# Patient Record
Sex: Male | Born: 1990 | Race: Black or African American | Hispanic: No | Marital: Single | Smoking: Current every day smoker
Health system: Southern US, Community
[De-identification: ages and names within clinical notes are randomized; demographics above are authoritative.]

---
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
## 2008-07-07 ENCOUNTER — Emergency Department: Payer: Self-pay | Admitting: Unknown Physician Specialty

## 2008-07-11 ENCOUNTER — Emergency Department: Payer: Self-pay | Admitting: Emergency Medicine

## 2011-06-01 ENCOUNTER — Emergency Department: Payer: Self-pay | Admitting: Emergency Medicine

## 2012-08-11 ENCOUNTER — Emergency Department: Payer: Self-pay | Admitting: Emergency Medicine

## 2013-09-14 ENCOUNTER — Emergency Department: Payer: Self-pay | Admitting: Emergency Medicine

## 2014-12-19 ENCOUNTER — Encounter: Payer: Self-pay | Admitting: Emergency Medicine

## 2014-12-19 ENCOUNTER — Emergency Department
Admission: EM | Admit: 2014-12-19 | Discharge: 2014-12-19 | Disposition: A | Discharge status: Home / Self Care | Payer: Self-pay | Attending: Emergency Medicine | Admitting: Emergency Medicine

## 2014-12-19 ENCOUNTER — Emergency Department: Payer: Self-pay

## 2014-12-19 DIAGNOSIS — W2209XA Striking against other stationary object, initial encounter: Secondary | ICD-10-CM | POA: Insufficient documentation

## 2014-12-19 DIAGNOSIS — Y9389 Activity, other specified: Secondary | ICD-10-CM | POA: Insufficient documentation

## 2014-12-19 DIAGNOSIS — Z720 Tobacco use: Secondary | ICD-10-CM | POA: Insufficient documentation

## 2014-12-19 DIAGNOSIS — S62231B Other displaced fracture of base of first metacarpal bone, right hand, initial encounter for open fracture: Secondary | ICD-10-CM | POA: Insufficient documentation

## 2014-12-19 DIAGNOSIS — Y9289 Other specified places as the place of occurrence of the external cause: Secondary | ICD-10-CM | POA: Insufficient documentation

## 2014-12-19 DIAGNOSIS — Y998 Other external cause status: Secondary | ICD-10-CM | POA: Insufficient documentation

## 2014-12-19 MED ORDER — HYDROCODONE-ACETAMINOPHEN 5-325 MG PO TABS: 1.0000 | ORAL_TABLET | ORAL | Status: DC | PRN

## 2014-12-19 MED ORDER — IBUPROFEN 800 MG PO TABS: 800.0000 mg | ORAL_TABLET | Freq: Three times a day (TID) | ORAL | Status: DC

## 2014-12-19 MED ORDER — IBUPROFEN 800 MG PO TABS
800.0000 mg | ORAL_TABLET | Freq: Once | ORAL | Status: AC
  Administered 2014-12-19: 800 mg via ORAL
  Filled 2014-12-19: qty 1

## 2014-12-19 NOTE — ED Notes (Signed)
Pt reports Tuesday night hitting the medial side of his right hand on the laminate, reports increased pain and swelling since then.
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
## 2015-03-12 ENCOUNTER — Emergency Department
Admission: EM | Admit: 2015-03-12 | Discharge: 2015-03-12 | Disposition: A | Discharge status: Other Acute Inpatient Hospital | Payer: No Typology Code available for payment source | Attending: Emergency Medicine | Admitting: Emergency Medicine

## 2015-03-12 ENCOUNTER — Emergency Department: Payer: No Typology Code available for payment source

## 2015-03-12 DIAGNOSIS — R079 Chest pain, unspecified: Secondary | ICD-10-CM

## 2015-03-12 DIAGNOSIS — R451 Restlessness and agitation: Secondary | ICD-10-CM | POA: Insufficient documentation

## 2015-03-12 DIAGNOSIS — S3991XA Unspecified injury of abdomen, initial encounter: Secondary | ICD-10-CM | POA: Insufficient documentation

## 2015-03-12 DIAGNOSIS — Y92410 Unspecified street and highway as the place of occurrence of the external cause: Secondary | ICD-10-CM | POA: Diagnosis not present

## 2015-03-12 DIAGNOSIS — F1210 Cannabis abuse, uncomplicated: Secondary | ICD-10-CM | POA: Insufficient documentation

## 2015-03-12 DIAGNOSIS — S79911A Unspecified injury of right hip, initial encounter: Secondary | ICD-10-CM | POA: Diagnosis present

## 2015-03-12 DIAGNOSIS — F1310 Sedative, hypnotic or anxiolytic abuse, uncomplicated: Secondary | ICD-10-CM | POA: Insufficient documentation

## 2015-03-12 DIAGNOSIS — F1110 Opioid abuse, uncomplicated: Secondary | ICD-10-CM | POA: Insufficient documentation

## 2015-03-12 DIAGNOSIS — F911 Conduct disorder, childhood-onset type: Secondary | ICD-10-CM | POA: Insufficient documentation

## 2015-03-12 DIAGNOSIS — Z791 Long term (current) use of non-steroidal anti-inflammatories (NSAID): Secondary | ICD-10-CM | POA: Diagnosis not present

## 2015-03-12 DIAGNOSIS — S73014A Posterior dislocation of right hip, initial encounter: Secondary | ICD-10-CM | POA: Insufficient documentation

## 2015-03-12 DIAGNOSIS — Y9389 Activity, other specified: Secondary | ICD-10-CM | POA: Insufficient documentation

## 2015-03-12 DIAGNOSIS — Y998 Other external cause status: Secondary | ICD-10-CM | POA: Insufficient documentation

## 2015-03-12 DIAGNOSIS — F10120 Alcohol abuse with intoxication, uncomplicated: Secondary | ICD-10-CM | POA: Diagnosis not present

## 2015-03-12 DIAGNOSIS — F17210 Nicotine dependence, cigarettes, uncomplicated: Secondary | ICD-10-CM | POA: Diagnosis not present

## 2015-03-12 DIAGNOSIS — S73004A Unspecified dislocation of right hip, initial encounter: Secondary | ICD-10-CM

## 2015-03-12 DIAGNOSIS — F10920 Alcohol use, unspecified with intoxication, uncomplicated: Secondary | ICD-10-CM

## 2015-03-12 LAB — URINALYSIS COMPLETE WITH MICROSCOPIC (ARMC ONLY)
BILIRUBIN URINE: NEGATIVE
GLUCOSE, UA: NEGATIVE mg/dL
LEUKOCYTES UA: NEGATIVE
Nitrite: NEGATIVE
Protein, ur: NEGATIVE mg/dL
SQUAMOUS EPITHELIAL / LPF: NONE SEEN
Specific Gravity, Urine: 1.058 — ABNORMAL HIGH (ref 1.005–1.030)
pH: 5 (ref 5.0–8.0)

## 2015-03-12 LAB — URINE DRUG SCREEN, QUALITATIVE (ARMC ONLY)
AMPHETAMINES, UR SCREEN: NOT DETECTED
BARBITURATES, UR SCREEN: NOT DETECTED
BENZODIAZEPINE, UR SCRN: POSITIVE — AB
Cannabinoid 50 Ng, Ur ~~LOC~~: POSITIVE — AB
Cocaine Metabolite,Ur ~~LOC~~: NOT DETECTED
MDMA (Ecstasy)Ur Screen: NOT DETECTED
METHADONE SCREEN, URINE: NOT DETECTED
Opiate, Ur Screen: POSITIVE — AB
Phencyclidine (PCP) Ur S: NOT DETECTED
TRICYCLIC, UR SCREEN: NOT DETECTED

## 2015-03-12 LAB — BLOOD GAS, ARTERIAL
ALLENS TEST (PASS/FAIL): POSITIVE — AB
Acid-base deficit: 3.4 mmol/L — ABNORMAL HIGH (ref 0.0–2.0)
Bicarbonate: 21.4 mEq/L (ref 21.0–28.0)
FIO2: 0.4
LHR: 18 {breaths}/min
MECHANICAL RATE: 18
MECHVT: 500 mL
O2 Saturation: 99 %
PATIENT TEMPERATURE: 37
PCO2 ART: 37 mmHg (ref 32.0–48.0)
PEEP: 10 cmH2O
PO2 ART: 136 mmHg — AB (ref 83.0–108.0)
pH, Arterial: 7.37 (ref 7.350–7.450)

## 2015-03-12 LAB — COMPREHENSIVE METABOLIC PANEL
ALT: 33 U/L (ref 17–63)
AST: 28 U/L (ref 15–41)
Albumin: 4.2 g/dL (ref 3.5–5.0)
Alkaline Phosphatase: 74 U/L (ref 38–126)
Anion gap: 14 (ref 5–15)
BUN: 10 mg/dL (ref 6–20)
CHLORIDE: 105 mmol/L (ref 101–111)
CO2: 21 mmol/L — AB (ref 22–32)
CREATININE: 0.86 mg/dL (ref 0.61–1.24)
Calcium: 9.1 mg/dL (ref 8.9–10.3)
GFR calc Af Amer: >60 mL/min (ref >60)
Glucose, Bld: 127 mg/dL — ABNORMAL HIGH (ref 65–99)
POTASSIUM: 3 mmol/L — AB (ref 3.5–5.1)
SODIUM: 140 mmol/L (ref 135–145)
Total Bilirubin: 1 mg/dL (ref 0.3–1.2)
Total Protein: 7.5 g/dL (ref 6.5–8.1)

## 2015-03-12 LAB — CBC
HCT: 42.2 % (ref 40.0–52.0)
Hemoglobin: 13.6 g/dL (ref 13.0–18.0)
MCH: 23.9 pg — ABNORMAL LOW (ref 26.0–34.0)
MCHC: 32.3 g/dL (ref 32.0–36.0)
MCV: 74.1 fL — AB (ref 80.0–100.0)
PLATELETS: 225 10*3/uL (ref 150–440)
RBC: 5.7 MIL/uL (ref 4.40–5.90)
RDW: 15 % — AB (ref 11.5–14.5)
WBC: 13 10*3/uL — ABNORMAL HIGH (ref 3.8–10.6)

## 2015-03-12 LAB — ETHANOL: Alcohol, Ethyl (B): 247 mg/dL — ABNORMAL HIGH (ref <5)

## 2015-03-12 LAB — PROTIME-INR
INR: 1.07
PROTHROMBIN TIME: 14.1 s (ref 11.4–15.0)

## 2015-03-12 MED ORDER — LORAZEPAM 2 MG/ML IJ SOLN
1.0000 mg | Freq: Once | INTRAMUSCULAR | Status: AC
  Administered 2015-03-12: 1 mg via INTRAVENOUS

## 2015-03-12 MED ORDER — ROCURONIUM BROMIDE 50 MG/5ML IV SOLN
10.0000 mg | Freq: Once | INTRAVENOUS | Status: AC
  Administered 2015-03-12: 10 mg via INTRAVENOUS

## 2015-03-12 MED ORDER — LORAZEPAM 2 MG/ML IJ SOLN
INTRAMUSCULAR | Status: AC
  Filled 2015-03-12: qty 1

## 2015-03-12 MED ORDER — PROPOFOL 1000 MG/100ML IV EMUL
5.0000 ug/kg/min | INTRAVENOUS | Status: DC
  Administered 2015-03-12: 25 ug/kg/min via INTRAVENOUS
  Administered 2015-03-12: 15 ug/kg/min via INTRAVENOUS
  Administered 2015-03-12: 40 ug/kg/min via INTRAVENOUS

## 2015-03-12 MED ORDER — DIPHENHYDRAMINE HCL 50 MG/ML IJ SOLN
50.0000 mg | Freq: Once | INTRAMUSCULAR | Status: AC
  Administered 2015-03-12: 50 mg via INTRAVENOUS

## 2015-03-12 MED ORDER — HALOPERIDOL LACTATE 5 MG/ML IJ SOLN
INTRAMUSCULAR | Status: AC
  Administered 2015-03-12: 5 mg via INTRAVENOUS
  Filled 2015-03-12: qty 1

## 2015-03-12 MED ORDER — DIPHENHYDRAMINE HCL 50 MG/ML IJ SOLN
INTRAMUSCULAR | Status: AC
  Administered 2015-03-12: 50 mg via INTRAVENOUS
  Filled 2015-03-12: qty 1

## 2015-03-12 MED ORDER — LORAZEPAM 2 MG/ML IJ SOLN
2.0000 mg | Freq: Once | INTRAMUSCULAR | Status: AC
  Administered 2015-03-12: 2 mg via INTRAVENOUS

## 2015-03-12 MED ORDER — ETOMIDATE 2 MG/ML IV SOLN
20.0000 mg | Freq: Once | INTRAVENOUS | Status: AC
  Administered 2015-03-12: 20 mg via INTRAVENOUS

## 2015-03-12 MED ORDER — MORPHINE SULFATE (PF) 4 MG/ML IV SOLN
INTRAVENOUS | Status: AC
  Administered 2015-03-12: 4 mg via INTRAVENOUS
  Filled 2015-03-12: qty 1

## 2015-03-12 MED ORDER — MORPHINE SULFATE (PF) 4 MG/ML IV SOLN
4.0000 mg | Freq: Once | INTRAVENOUS | Status: AC
  Administered 2015-03-12: 4 mg via INTRAVENOUS

## 2015-03-12 MED ORDER — ONDANSETRON HCL 4 MG/2ML IJ SOLN
INTRAMUSCULAR | Status: AC
  Administered 2015-03-12: 4 mg via INTRAVENOUS
  Filled 2015-03-12: qty 2

## 2015-03-12 MED ORDER — SUCCINYLCHOLINE CHLORIDE 20 MG/ML IJ SOLN
100.0000 mg | Freq: Once | INTRAMUSCULAR | Status: AC
  Administered 2015-03-12: 100 mg via INTRAVENOUS

## 2015-03-12 MED ORDER — ONDANSETRON HCL 4 MG/2ML IJ SOLN
4.0000 mg | Freq: Once | INTRAMUSCULAR | Status: AC
  Administered 2015-03-12: 4 mg via INTRAVENOUS

## 2015-03-12 MED ORDER — MIDAZOLAM HCL 5 MG/5ML IJ SOLN
5.0000 mg | Freq: Once | INTRAMUSCULAR | Status: AC
  Administered 2015-03-12: 5 mg via INTRAVENOUS

## 2015-03-12 MED ORDER — IOHEXOL 350 MG/ML SOLN
125.0000 mL | Freq: Once | INTRAVENOUS | Status: AC | PRN
  Administered 2015-03-12: 125 mL via INTRAVENOUS

## 2015-03-12 MED ORDER — SODIUM CHLORIDE 0.9 % IV BOLUS (SEPSIS)
1000.0000 mL | Freq: Once | INTRAVENOUS | Status: AC
  Administered 2015-03-12: 1000 mL via INTRAVENOUS

## 2015-03-12 MED ORDER — HALOPERIDOL LACTATE 5 MG/ML IJ SOLN
INTRAMUSCULAR | Status: AC
  Filled 2015-03-12: qty 1

## 2015-03-12 MED ORDER — HALOPERIDOL LACTATE 5 MG/ML IJ SOLN
5.0000 mg | Freq: Once | INTRAMUSCULAR | Status: AC
  Administered 2015-03-12: 5 mg via INTRAVENOUS

## 2015-03-12 MED ORDER — LORAZEPAM 2 MG/ML IJ SOLN
INTRAMUSCULAR | Status: AC
  Administered 2015-03-12: 1 mg via INTRAVENOUS
  Filled 2015-03-12: qty 1

## 2015-03-12 NOTE — ED Notes (Signed)
Necklace with cross and two earrings given to pt's mother, Riley Morgan

## 2015-03-12 NOTE — ED Notes (Signed)
Report called to Doree FudgeLuz, [phone] Care Coordinator, and ED charge Elpidio GaleaSusan, RN [phone] at [phone],  [facility], signature pad inop in pt room, Father and mother Briscoe Burnsrchie and Dot Lanesmy Fuller, both signed consent for transfer.
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
## 2015-03-12 NOTE — ED Notes (Signed)
[name] to inform of pt's en route

## 2015-03-12 NOTE — ED Notes (Signed)
Pt repeatedly states: "help me man. Don't do me like this man. Man it hurt." Pt is combative with nursing/medical staff.  Pt does not follow instructions.
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
## 2015-03-24 ENCOUNTER — Emergency Department
Admission: EM | Admit: 2015-03-24 | Discharge: 2015-03-24 | Disposition: A | Discharge status: Home / Self Care | Payer: Self-pay | Attending: Emergency Medicine | Admitting: Emergency Medicine

## 2015-03-24 ENCOUNTER — Emergency Department: Payer: Self-pay

## 2015-03-24 ENCOUNTER — Encounter: Payer: Self-pay | Admitting: Emergency Medicine

## 2015-03-24 DIAGNOSIS — M25551 Pain in right hip: Secondary | ICD-10-CM | POA: Insufficient documentation

## 2015-03-24 DIAGNOSIS — Z87891 Personal history of nicotine dependence: Secondary | ICD-10-CM | POA: Insufficient documentation

## 2015-03-24 MED ORDER — HYDROCODONE-ACETAMINOPHEN 5-325 MG PO TABS: 1.0000 | ORAL_TABLET | ORAL | Status: DC | PRN

## 2015-03-24 MED ORDER — KETOROLAC TROMETHAMINE 30 MG/ML IJ SOLN
30.0000 mg | Freq: Once | INTRAMUSCULAR | Status: AC
  Administered 2015-03-24: 30 mg via INTRAMUSCULAR
  Filled 2015-03-24: qty 1

## 2015-03-24 MED ORDER — NAPROXEN 500 MG PO TABS: 500.0000 mg | ORAL_TABLET | Freq: Two times a day (BID) | ORAL | Status: DC

## 2015-03-24 NOTE — Discharge Instructions (Signed)
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
## 2017-12-06 ENCOUNTER — Emergency Department: Payer: No Typology Code available for payment source

## 2017-12-06 ENCOUNTER — Encounter: Payer: Self-pay | Admitting: Emergency Medicine

## 2017-12-06 ENCOUNTER — Other Ambulatory Visit: Payer: Self-pay

## 2017-12-06 ENCOUNTER — Emergency Department
Admission: EM | Admit: 2017-12-06 | Discharge: 2017-12-06 | Disposition: A | Discharge status: Home / Self Care | Payer: No Typology Code available for payment source | Attending: Emergency Medicine | Admitting: Emergency Medicine

## 2017-12-06 DIAGNOSIS — M7918 Myalgia, other site: Secondary | ICD-10-CM | POA: Diagnosis not present

## 2017-12-06 DIAGNOSIS — Z87891 Personal history of nicotine dependence: Secondary | ICD-10-CM | POA: Insufficient documentation

## 2017-12-06 DIAGNOSIS — M542 Cervicalgia: Secondary | ICD-10-CM | POA: Diagnosis present

## 2017-12-06 DIAGNOSIS — Z79899 Other long term (current) drug therapy: Secondary | ICD-10-CM | POA: Insufficient documentation

## 2017-12-06 MED ORDER — CYCLOBENZAPRINE HCL 5 MG PO TABS: 5.0000 mg | ORAL_TABLET | Freq: Three times a day (TID) | ORAL | 0 refills | Status: AC | PRN

## 2017-12-06 MED ORDER — IBUPROFEN 600 MG PO TABS: 600.0000 mg | ORAL_TABLET | Freq: Four times a day (QID) | ORAL | 0 refills | Status: DC | PRN
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
## 2018-11-06 ENCOUNTER — Encounter: Payer: Self-pay | Admitting: Emergency Medicine

## 2018-11-06 ENCOUNTER — Emergency Department
Admission: EM | Admit: 2018-11-06 | Discharge: 2018-11-07 | Disposition: A | Discharge status: Home / Self Care | Payer: Self-pay | Attending: Emergency Medicine | Admitting: Emergency Medicine

## 2018-11-06 ENCOUNTER — Other Ambulatory Visit: Payer: Self-pay

## 2018-11-06 ENCOUNTER — Emergency Department: Payer: Self-pay

## 2018-11-06 DIAGNOSIS — W010XXA Fall on same level from slipping, tripping and stumbling without subsequent striking against object, initial encounter: Secondary | ICD-10-CM | POA: Insufficient documentation

## 2018-11-06 DIAGNOSIS — Y999 Unspecified external cause status: Secondary | ICD-10-CM | POA: Insufficient documentation

## 2018-11-06 DIAGNOSIS — Y9389 Activity, other specified: Secondary | ICD-10-CM | POA: Insufficient documentation

## 2018-11-06 DIAGNOSIS — Y929 Unspecified place or not applicable: Secondary | ICD-10-CM | POA: Insufficient documentation

## 2018-11-06 DIAGNOSIS — Z87891 Personal history of nicotine dependence: Secondary | ICD-10-CM | POA: Insufficient documentation

## 2018-11-06 DIAGNOSIS — Z79899 Other long term (current) drug therapy: Secondary | ICD-10-CM | POA: Insufficient documentation

## 2018-11-06 DIAGNOSIS — S63259A Unspecified dislocation of unspecified finger, initial encounter: Secondary | ICD-10-CM

## 2018-11-06 DIAGNOSIS — S62653A Nondisplaced fracture of medial phalanx of left middle finger, initial encounter for closed fracture: Secondary | ICD-10-CM | POA: Insufficient documentation

## 2018-11-06 MED ORDER — HYDROMORPHONE HCL 1 MG/ML IJ SOLN
1.0000 mg | INTRAMUSCULAR | Status: AC
  Administered 2018-11-06: 1 mg via INTRAMUSCULAR
  Filled 2018-11-06: qty 1

## 2018-11-06 NOTE — ED Triage Notes (Signed)
Patient ambulatory to triage with steady gait, without difficulty, appears uncomfortable--diaphoretic, restless; pt reports slipped and fell attempting to catch self with hand; deformity noted to left 3rd finger

## 2018-11-07 ENCOUNTER — Emergency Department: Payer: Self-pay

## 2018-11-07 MED ORDER — LIDOCAINE HCL (PF) 1 % IJ SOLN
5.0000 mL | Freq: Once | INTRAMUSCULAR | Status: AC
  Administered 2018-11-07: 5 mL via INTRADERMAL
  Filled 2018-11-07: qty 5

## 2018-11-07 NOTE — Discharge Instructions (Signed)
Please keep your fingers taped for 1-2 weeks to support your dislocated joint in the middle finger.  The bone has a small crack as well, but this likely will not need any additional treatment.  I strongly recommend that you follow-up with a hand specialist to make sure that you get back to full usage of your hand.  Please call the phone number listed above.  Use over-the-counter ibuprofen and Tylenol as needed.  Keep the wound between the 2 fingers clean and dry and return to the ED if you develop any signs or symptoms of infection.

## 2018-11-07 NOTE — ED Notes (Signed)
Finger splint in place with wrap.

## 2018-11-07 NOTE — ED Provider Notes (Signed)
[facility] Emergency Department Provider Note  ____________________________________________   First MD Initiated Contact with Patient 11/06/18 2357     (approximate)  I have reviewed the triage vital signs and the nursing notes.   HISTORY  Chief Complaint Finger Injury    HPI Riley Morgan is a 28 y.o. male with no contributory past medical history who presents for evaluation of deformity and severe pain in his left middle finger.  It was acute in onset and the pain is severe.  He reports that he had a few beers tonight and then he stumbled and fell and try to catch himself but caught his left middle finger on something and pulled it out of place.  He has little bit of bleeding and is not sure where it was coming from.  Bleeding is currently well controlled.  He has no other pain.  He did not lose consciousness nor strike his head and he denies headache and neck pain.  He has no pain anywhere else.  Try to move the finger makes it worse and nothing particular makes it better.         History reviewed. No pertinent past medical history.  There are no active problems to display for this patient.   History reviewed. No pertinent surgical history.  Prior to Admission medications   Medication Sig Start Date End Date Taking? Authorizing Provider  HYDROcodone-acetaminophen (NORCO/VICODIN) 5-325 MG tablet Take 1 tablet by mouth every 4 (four) hours as needed for moderate pain. 03/24/15   Jene EveryKinner, Robert, MD  ibuprofen (ADVIL,MOTRIN) 600 MG tablet Take 1 tablet (600 mg total) by mouth every 6 (six) hours as needed. 12/06/17   Enid DerryWagner, Ashley, PA-C  naproxen (NAPROSYN) 500 MG tablet Take 1 tablet (500 mg total) by mouth 2 (two) times daily with a meal. 03/24/15   Jene EveryKinner, Robert, MD  oxyCODONE (OXY IR/ROXICODONE) 5 MG immediate release tablet Take 5-10 mg by mouth every 4 (four) hours as needed for severe pain.  03/14/15   [provider]     Allergies Patient has no known allergies.  No family history on file.  Social History Social History   Tobacco Use   Smoking status: Former Smoker    Types: Cigarettes   Smokeless tobacco: Never Used  Substance Use Topics   Alcohol use: No    Comment: 5th a week   Drug use: Yes    Types: Marijuana    Review of Systems Constitutional: No fever/chills Eyes: No visual changes. ENT: No sore throat. Cardiovascular: Denies chest pain. Respiratory: Denies shortness of breath. Gastrointestinal: No abdominal pain.  No nausea, no vomiting.  No diarrhea.  No constipation. Genitourinary: Negative for dysuria. Musculoskeletal: Severe pain in left middle finger.  Negative for neck pain.  Negative for back pain. Integumentary: Negative for rash. Neurological: Negative for headaches, focal weakness or numbness.   ____________________________________________   PHYSICAL EXAM:  VITAL SIGNS: ED Triage Vitals  Enc Vitals Group     BP 11/07/18 0005 (!) 139/98     Pulse Rate 11/06/18 2335 93     Resp 11/06/18 2335 20     Temp 11/06/18 2335 98 F (36.7 C)     Temp Source 11/06/18 2335 Oral     SpO2 11/06/18 2335 98 %     Weight 11/06/18 2335 124.7 kg (275 lb)     Height 11/06/18 2335 1.727 m (5\' 8" )     Head Circumference --      Peak Flow --  Pain Score 11/06/18 2341 10     Pain Loc --      Pain Edu? --      Excl. in [place]? --     Constitutional: Alert and oriented.  Appears to be in pain but is not in any respiratory distress. Eyes: Conjunctivae are normal.  Head: Atraumatic. Cardiovascular: Normal rate, regular rhythm. Good peripheral circulation. Respiratory: Normal respiratory effort.  No retractions.  Musculoskeletal: Gross deformity of the left middle finger at the PIP with apparent dorsal dislocation of the middle phalanx over the top of the proximal phalanx.  The skin is tenting on the palmar side at the PIP but the skin is not disrupted.  There is a small  laceration in between the middle and ring fingers of the left hand that likely is due to the force with which the fingers were ripped apart, but the bleeding is well controlled and the wound is small and does not require closure. Neurologic:  Normal speech and language. No gross focal neurologic deficits are appreciated.  Skin:  Skin is warm, dry and intact. No rash noted. Psychiatric: Mood and affect are normal. Speech and behavior are normal.  ____________________________________________   LABS (all labs ordered are listed, but only abnormal results are displayed)  Labs Reviewed - No data to display ____________________________________________  EKG  None - EKG not ordered by ED physician ____________________________________________  RADIOLOGY I, Hinda Kehr, personally viewed and evaluated these images (plain radiographs) as part of my medical decision making, as well as reviewing the written report by the radiologist.  ED MD interpretation: Dorsal dislocation at the PIP.  Minimally displaced fracture about the dorsal cortex of the middle phalanx. Post-reduction films re-demonstrate the small fracture but the dislocation was adequately reduced.  Official radiology report(s): Dg Finger Middle Left  Result Date: 11/07/2018 CLINICAL DATA:  Postreduction. EXAM: LEFT MIDDLE FINGER 2+V COMPARISON:  Pre reduction radiograph yesterday. FINDINGS: Interval reduction of the middle phalanx dislocation at the proximal interphalangeal joint. Alignment is anatomic. Nondisplaced fracture involving the dorsal cortex of the middle phalanx at the articular surface. Generalized soft tissue edema. IMPRESSION: Reduction of prior middle phalanx dislocation at the proximal interphalangeal joint. Nondisplaced fracture involving the dorsal cortex of the middle phalanx. Electronically Signed   By: Keith Rake M.D.   On: 11/07/2018 02:05   Dg Finger Middle Left  Result Date: 11/07/2018 CLINICAL DATA:  Slip  and fall with deformity to left third finger. EXAM: LEFT MIDDLE FINGER 2+V COMPARISON:  None. FINDINGS: Dorsal dislocation of the digit at the proximal interphalangeal joint. Minimally displaced fracture about the dorsal cortex of the middle phalanx at the articular surface. Distal phalanx remains normally aligned with the proximal phalanx. IMPRESSION: Dorsal dislocation of the digit at the proximal interphalangeal joint. Minimally displaced fracture about the dorsal cortex of the middle phalanx at the articular surface. Electronically Signed   By: Keith Rake M.D.   On: 11/07/2018 00:01   Dg Finger Ring Left  Result Date: 11/07/2018 CLINICAL DATA:  Left ring finger pain and swelling. EXAM: LEFT RING FINGER 2+V COMPARISON:  None. FINDINGS: There is no evidence of fracture or dislocation. There is no evidence of arthropathy or other focal bone abnormality. Mild soft tissue edema. IMPRESSION: Soft tissue edema without fracture or dislocation. Electronically Signed   By: Keith Rake M.D.   On: 11/07/2018 02:06    ____________________________________________   PROCEDURES   Procedure(s) performed (including Critical Care):  .Ortho Injury Treatment  Date/Time: 11/07/2018 1:03 AM  Performed by: Loleta RoseForbach, Jace Dowe, MD Authorized by: Loleta RoseForbach, Amalio Loe, MD   Consent:    Consent obtained:  Verbal   Consent given by:  Patient   Risks discussed:  Fracture, nerve damage, restricted joint movement and stiffnessInjury location: finger Location details: left long finger Injury type: dislocation Dislocation type: PIP Pre-procedure neurovascular assessment: neurovascularly intact Pre-procedure distal perfusion: normal Pre-procedure neurological function: normal Pre-procedure range of motion: reduced Anesthesia: digital block  Anesthesia: Local anesthesia used: yes Local Anesthetic: lidocaine 1% without epinephrine Anesthetic total: 5 mL  Patient sedated: NoManipulation performed: yes Reduction  successful: yes X-ray confirmed reduction: yes Immobilization: tape Splint type: buddy tape. Post-procedure neurovascular assessment: post-procedure neurovascularly intact Post-procedure distal perfusion: normal Post-procedure neurological function: normal Post-procedure range of motion: normal Patient tolerance: patient tolerated the procedure well with no immediate complications      ____________________________________________   INITIAL IMPRESSION / MDM / ASSESSMENT AND PLAN / ED COURSE  As part of my medical decision making, I reviewed the following data within the electronic MEDICAL RECORD NUMBER Nursing notes reviewed and incorporated, Old chart reviewed, Radiograph reviewed , Notes from prior ED visits and [place] Controlled Substance Database   Patient has a dislocated PIP on the left middle finger with a very small nondisplaced cortical fracture on the dorsal side.  I performed a digital block after he received Dilaudid 1 mg IV and achieved adequate analgesia.  I then manipulated the finger and reduced the dislocation, verified by x-ray.  The patient was eager to leave after the images and I was not able to get back into see the room due to other critical patients, but the nurse was able to buddy tape his middle finger to the ring finger and I had already given him my usual and customary management recommendations and return precautions.  No indication for antibiotics.  This was not a closed fracture even though he does have a skin tear between the middle and ring fingers and I encouraged him to keep it clean and dry as much as possible.          ____________________________________________  FINAL CLINICAL IMPRESSION(S) / ED DIAGNOSES  Final diagnoses:  Dislocation of finger, initial encounter  Closed nondisplaced fracture of middle phalanx of left middle finger, initial encounter     MEDICATIONS GIVEN DURING THIS VISIT:  Medications  HYDROmorphone (DILAUDID) injection 1 mg  (1 mg Intramuscular Given 11/06/18 2357)  lidocaine (PF) (XYLOCAINE) 1 % injection 5 mL (5 mLs Intradermal Given 11/07/18 0053)     ED Discharge Orders    None      *Please note:  Riley Morgan was evaluated in Emergency Department on 11/07/2018 for the symptoms described in the history of present illness. He was evaluated in the context of the global COVID-19 pandemic, which necessitated consideration that the patient might be at risk for infection with the SARS-CoV-2 virus that causes COVID-19. Institutional protocols and algorithms that pertain to the evaluation of patients at risk for COVID-19 are in a state of rapid change based on information released by regulatory bodies including the CDC and federal and state organizations. These policies and algorithms were followed during the patient's care in the ED.  Some ED evaluations and interventions may be delayed as a result of limited staffing during the pandemic.*  Note:  This document was prepared using Dragon voice recognition software and may include unintentional dictation errors.   Loleta RoseForbach, Tarris Delbene, MD 11/07/18 [phone]

## 2019-04-06 ENCOUNTER — Encounter: Payer: Self-pay | Admitting: Emergency Medicine

## 2019-04-06 ENCOUNTER — Emergency Department: Payer: Self-pay

## 2019-04-06 ENCOUNTER — Other Ambulatory Visit: Payer: Self-pay

## 2019-04-06 ENCOUNTER — Emergency Department
Admission: EM | Admit: 2019-04-06 | Discharge: 2019-04-06 | Disposition: A | Discharge status: Home / Self Care | Payer: Self-pay | Attending: Emergency Medicine | Admitting: Emergency Medicine

## 2019-04-06 DIAGNOSIS — K30 Functional dyspepsia: Secondary | ICD-10-CM | POA: Insufficient documentation

## 2019-04-06 DIAGNOSIS — F17210 Nicotine dependence, cigarettes, uncomplicated: Secondary | ICD-10-CM | POA: Insufficient documentation

## 2019-04-06 DIAGNOSIS — R0789 Other chest pain: Secondary | ICD-10-CM | POA: Insufficient documentation

## 2019-04-06 LAB — CBC WITH DIFFERENTIAL/PLATELET
Abs Immature Granulocytes: 0.04 10*3/uL (ref 0.00–0.07)
Basophils Absolute: 0 10*3/uL (ref 0.0–0.1)
Basophils Relative: 1 %
Eosinophils Absolute: 0.2 10*3/uL (ref 0.0–0.5)
Eosinophils Relative: 3 %
HCT: 43.9 % (ref 39.0–52.0)
Hemoglobin: 14 g/dL (ref 13.0–17.0)
Immature Granulocytes: 1 %
Lymphocytes Relative: 26 %
Lymphs Abs: 2 10*3/uL (ref 0.7–4.0)
MCH: 24 pg — ABNORMAL LOW (ref 26.0–34.0)
MCHC: 31.9 g/dL (ref 30.0–36.0)
MCV: 75.2 fL — ABNORMAL LOW (ref 80.0–100.0)
Monocytes Absolute: 0.3 10*3/uL (ref 0.1–1.0)
Monocytes Relative: 5 %
Neutro Abs: 4.9 10*3/uL (ref 1.7–7.7)
Neutrophils Relative %: 64 %
Platelets: 233 10*3/uL (ref 150–400)
RBC: 5.84 MIL/uL — ABNORMAL HIGH (ref 4.22–5.81)
RDW: 14.6 % (ref 11.5–15.5)
WBC: 7.5 10*3/uL (ref 4.0–10.5)
nRBC: 0 % (ref 0.0–0.2)

## 2019-04-06 LAB — URINALYSIS, COMPLETE (UACMP) WITH MICROSCOPIC
Bacteria, UA: NONE SEEN
Bilirubin Urine: NEGATIVE
Glucose, UA: NEGATIVE mg/dL
Hgb urine dipstick: NEGATIVE
Ketones, ur: NEGATIVE mg/dL
Leukocytes,Ua: NEGATIVE
Nitrite: NEGATIVE
Protein, ur: NEGATIVE mg/dL
Specific Gravity, Urine: 1.019 (ref 1.005–1.030)
pH: 7 (ref 5.0–8.0)

## 2019-04-06 LAB — COMPREHENSIVE METABOLIC PANEL
ALT: 29 U/L (ref 0–44)
AST: 24 U/L (ref 15–41)
Albumin: 4.1 g/dL (ref 3.5–5.0)
Alkaline Phosphatase: 73 U/L (ref 38–126)
Anion gap: 11 (ref 5–15)
BUN: 10 mg/dL (ref 6–20)
CO2: 20 mmol/L — ABNORMAL LOW (ref 22–32)
Calcium: 9.1 mg/dL (ref 8.9–10.3)
Chloride: 107 mmol/L (ref 98–111)
Creatinine, Ser: 0.89 mg/dL (ref 0.61–1.24)
GFR calc Af Amer: >60 mL/min (ref >60)
GFR calc non Af Amer: >60 mL/min (ref >60)
Glucose, Bld: 123 mg/dL — ABNORMAL HIGH (ref 70–99)
Potassium: 3.4 mmol/L — ABNORMAL LOW (ref 3.5–5.1)
Sodium: 138 mmol/L (ref 135–145)
Total Bilirubin: 0.9 mg/dL (ref 0.3–1.2)
Total Protein: 7.3 g/dL (ref 6.5–8.1)

## 2019-04-06 LAB — TROPONIN I (HIGH SENSITIVITY): Troponin I (High Sensitivity): 3 ng/L (ref <18)

## 2019-04-06 NOTE — ED Triage Notes (Addendum)
Pt reports has had nausea in morning that will last about 5-10 minutes then go away.  denies any vomiting or diarrhea. No fevers. No abdominal pain.  Today nausea has been on and off all day so decided to come get checked.  Ambulatory without difficulty.  Reports sometimes has felt fine today and then sometimes gets nauseated.  No pain at all.  Unlabored.  C/o SHOB and chest feeling heavy today.  Pt is overweight and smoker but also states "I think I need a check up".  Now states "I feel like I am going to pass out today, just don't feel myself." has been drinking daily for past couple weeks. Drank half gallon the other day.  Last drink yesterday evening.

## 2019-04-06 NOTE — ED Notes (Signed)
Pt standing at door of room asking where the exit is, I asked pt if he was being discharged and he states "there isnt anything wrong with me and that will take 3 hours". Advised patient he is up for discharge and I would get his paperwork to him as soon as possible but patient did not want to wait and walked out to the waiting room

## 2019-04-06 NOTE — Discharge Instructions (Addendum)
For your symptoms:  - STOP eating after 8 PM, and avoid foods high in spice or acid - Try to cut back on smoking  - START taking an over-the-counter antacid.  I would recommend starting with over-the-counter famotidine or Pepcid, 1 tablet daily.  This can be increased to twice a day.  I would give this up to 1 week to see if it helps your symptoms.  -If the Pepcid does not completely resolve your symptoms, consider starting omeprazole or other over-the-counter proton pump inhibitor.  You can discuss with the pharmacist.  - You could also try sitting more upright when sleeping, for example using 2 pillows.

## 2019-04-06 NOTE — ED Provider Notes (Signed)
[facility] Emergency Department Provider Note  ____________________________________________   First MD Initiated Contact with Patient 04/06/19 1531     (approximate)  I have reviewed the triage vital signs and the nursing notes.   HISTORY  Chief Complaint Nausea    HPI Riley Morgan is a 28 y.o. male here with intermittent atypical chest pain indigestion.  The patient states that for the last 6 to 8 months, he has had episodes in which everyone, he wakes up, he has a mild sore throat and hoarseness.  He then experiences mild burning chest pain with associated nausea and sensation that he needs to vomit or spit up.  This usually only occurs after he has been sleeping or lying down.  Denies any leaving factors.  He does not take any regular antacids, but does admit to a history of acid reflux.  No fevers or chills.  No shortness of breath.  No known history of coronary disease.  Symptoms are usually related to waking up as mentioned, or when sitting up after lying down.  No specific alleviating or aggravating factors.  No other complaints.        History reviewed. No pertinent past medical history.  There are no problems to display for this patient.   History reviewed. No pertinent surgical history.  Prior to Admission medications   Not on File    Allergies Patient has no known allergies.  History reviewed. No pertinent family history.  Social History Social History   Tobacco Use  . Smoking status: Current Every Day Smoker    Types: Cigarettes  . Smokeless tobacco: Never Used  Substance Use Topics  . Alcohol use: No    Comment: 5th a week  . Drug use: Yes    Types: Marijuana    Review of Systems  Review of Systems  Constitutional: Negative for chills, fatigue and fever.  HENT: Negative for sore throat.   Respiratory: Negative for shortness of breath.   Cardiovascular: Positive for chest pain.  Gastrointestinal: Positive for  nausea. Negative for abdominal pain.  Genitourinary: Negative for flank pain.  Musculoskeletal: Negative for neck pain.  Skin: Negative for rash and wound.  Allergic/Immunologic: Negative for immunocompromised state.  Neurological: Negative for weakness and numbness.  Hematological: Does not bruise/bleed easily.  All other systems reviewed and are negative.    ____________________________________________  PHYSICAL EXAM:      VITAL SIGNS: ED Triage Vitals  Enc Vitals Group     BP 04/06/19 1403 (!) 128/100     Pulse Rate 04/06/19 1403 73     Resp 04/06/19 1403 18     Temp 04/06/19 1403 98.9 F (37.2 C)     Temp Source 04/06/19 1403 Oral     SpO2 04/06/19 1403 98 %     Weight 04/06/19 1405 280 lb (127 kg)     Height 04/06/19 1405 5\' 8"  (1.727 m)     Head Circumference --      Peak Flow --      Pain Score 04/06/19 1405 0     Pain Loc --      Pain Edu? --      Excl. in [place]? --      Physical Exam Vitals and nursing note reviewed.  Constitutional:      General: He is not in acute distress.    Appearance: He is well-developed.  HENT:     Head: Normocephalic and atraumatic.  Eyes:     Conjunctiva/sclera: Conjunctivae  normal.  Cardiovascular:     Rate and Rhythm: Normal rate and regular rhythm.     Heart sounds: Normal heart sounds.  Pulmonary:     Effort: Pulmonary effort is normal. No respiratory distress.     Breath sounds: No wheezing.  Abdominal:     General: Abdomen is flat. There is no distension.  Musculoskeletal:     Cervical back: Neck supple.  Skin:    General: Skin is warm.     Capillary Refill: Capillary refill takes less than 2 seconds.     Findings: No rash.  Neurological:     Mental Status: He is alert and oriented to person, place, and time.     Motor: No abnormal muscle tone.       ____________________________________________   LABS (all labs ordered are listed, but only abnormal results are displayed)  Labs Reviewed  CBC WITH  DIFFERENTIAL/PLATELET - Abnormal; Notable for the following components:      Result Value   RBC 5.84 (*)    MCV 75.2 (*)    MCH 24.0 (*)    All other components within normal limits  COMPREHENSIVE METABOLIC PANEL - Abnormal; Notable for the following components:   Potassium 3.4 (*)    CO2 20 (*)    Glucose, Bld 123 (*)    All other components within normal limits  URINALYSIS, COMPLETE (UACMP) WITH MICROSCOPIC - Abnormal; Notable for the following components:   Color, Urine YELLOW (*)    APPearance CLEAR (*)    All other components within normal limits  TROPONIN I (HIGH SENSITIVITY)  TROPONIN I (HIGH SENSITIVITY)    ____________________________________________  EKG: Normal sinus rhythm, ventricular rate 82.PR 176, QRS 80, QTc 429.  No acute ST elevations or depressions. ________________________________________  RADIOLOGY All imaging, including plain films, CT scans, and ultrasounds, independently reviewed by me, and interpretations confirmed via formal radiology reads.  ED MD interpretation:     Official radiology report(s): DG Chest 2 View  Result Date: 04/06/2019 CLINICAL DATA:  Chest pain, shortness of breath EXAM: CHEST - 2 VIEW COMPARISON:  03/12/2015 FINDINGS: Normal heart size, mediastinal contours, and pulmonary vascularity. Lungs clear. No pleural effusion or pneumothorax. Bones unremarkable. IMPRESSION: Normal exam. Electronically Signed   By: Ulyses SouthwardMark  Boles M.D.   On: 04/06/2019 14:34    ____________________________________________  PROCEDURES   Procedure(s) performed (including Critical Care):  Procedures  ____________________________________________  INITIAL IMPRESSION / MDM / ASSESSMENT AND PLAN / ED COURSE  As part of my medical decision making, I reviewed the following data within the electronic MEDICAL RECORD NUMBER Nursing notes reviewed and incorporated, Old chart reviewed, Notes from prior ED visits, and [place] Controlled Substance Database        *Riley Morgan was evaluated in Emergency Department on 04/06/2019 for the symptoms described in the history of present illness. He was evaluated in the context of the global COVID-19 pandemic, which necessitated consideration that the patient might be at risk for infection with the SARS-CoV-2 virus that causes COVID-19. Institutional protocols and algorithms that pertain to the evaluation of patients at risk for COVID-19 are in a state of rapid change based on information released by regulatory bodies including the CDC and federal and state organizations. These policies and algorithms were followed during the patient's care in the ED.  Some ED evaluations and interventions may be delayed as a result of limited staffing during the pandemic.*     Medical Decision Making: Very pleasant 28 year old male here with atypical chest  pain after waking up with mild chest burning sensation. On arrival, VSS and WNL. This has been ongoing for months. The association with sitting upright after lying down w/ chest burning and watering in mouth, with association w/ eating late, is most c/w likely acid reflux w/ possible component of PND. No signs of ACS and EKG is nonischemic with neg trop despite constant sx x days to weeks - doubt ACS. Pain is not consistent with PE, dissection. Will have him trial antacids, stop eating late, trial 2 pillows at night, and f/u with a PCP.  ____________________________________________  FINAL CLINICAL IMPRESSION(S) / ED DIAGNOSES  Final diagnoses:  Indigestion  Atypical chest pain     MEDICATIONS GIVEN DURING THIS VISIT:  Medications - No data to display   ED Discharge Orders    None       Note:  This document was prepared using Dragon voice recognition software and may include unintentional dictation errors.   Shaune Pollack, MD 04/06/19 [phone]

## 2019-04-06 NOTE — ED Notes (Signed)
See triage note  States he developed some nausea this am  States the nausea has been on and off all days  Denies any fever or n/v/d   Afebrile on arrival

## 2019-07-14 ENCOUNTER — Other Ambulatory Visit
Admission: AD | Admit: 2019-07-14 | Discharge: 2019-07-14 | Disposition: A | Discharge status: Home / Self Care | Attending: Family Medicine | Admitting: Family Medicine

## 2019-07-14 NOTE — ED Notes (Signed)
Pt arrived to ED for forensic blood draw with [employer] Officer Ayer. Draw was attempted x 3 on pt. First attempt was on left AC and unsuccessful. Draw attempted x 2 with [place] on left AC and without sufficient result. Finally blood draw was attempted x 3 with [place] on right AC with success. All three attempts used Betadine as the cleaning agent and site was left clean, dry, and intact after stick. Pt was escorted by officer from facility.

## 2019-08-26 ENCOUNTER — Emergency Department
Admission: EM | Admit: 2019-08-26 | Discharge: 2019-08-26 | Disposition: A | Discharge status: Home / Self Care | Attending: Student | Admitting: Student

## 2019-08-26 ENCOUNTER — Other Ambulatory Visit: Payer: Self-pay

## 2019-08-26 DIAGNOSIS — F10920 Alcohol use, unspecified with intoxication, uncomplicated: Secondary | ICD-10-CM | POA: Insufficient documentation

## 2019-08-26 DIAGNOSIS — F17210 Nicotine dependence, cigarettes, uncomplicated: Secondary | ICD-10-CM | POA: Insufficient documentation

## 2019-08-26 NOTE — ED Notes (Signed)
Pt refused updated vital signs. Pt refused to be registered. Pt ambulatory. A&Ox4. Stable while ambulating. Pt ambulated independent out of department without assistance.

## 2019-08-26 NOTE — ED Triage Notes (Signed)
Pt brought in by [facility] for etoh intoxication. Pt is not able to stay awake in triage. Pt was found in car by police who called ems.

## 2019-08-26 NOTE — ED Notes (Signed)
Pt is responsive to voice, oriented x4.  Pt states he drank too much tonight.  Speech is clear. Pt falls back to sleep between questions.  RR 24.

## 2019-08-26 NOTE — ED Notes (Signed)
Pt came up to nursing station stating "where is my keys? I am ready to go." Pt made aware that I will let his RN know that he is ready to go. Hunter, RN made aware and will go to rm to speak to pt.

## 2019-08-26 NOTE — Discharge Instructions (Addendum)
You should cut down on your drinking. Return to ER for other concerns.
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
## 2021-01-24 ENCOUNTER — Emergency Department
Admission: EM | Admit: 2021-01-24 | Discharge: 2021-01-24 | Disposition: A | Discharge status: Home / Self Care | Payer: Self-pay | Attending: Emergency Medicine | Admitting: Emergency Medicine

## 2021-01-24 ENCOUNTER — Other Ambulatory Visit: Payer: Self-pay

## 2021-01-24 ENCOUNTER — Encounter: Payer: Self-pay | Admitting: Emergency Medicine

## 2021-01-24 DIAGNOSIS — Z20822 Contact with and (suspected) exposure to covid-19: Secondary | ICD-10-CM | POA: Insufficient documentation

## 2021-01-24 DIAGNOSIS — Z28310 Unvaccinated for covid-19: Secondary | ICD-10-CM | POA: Insufficient documentation

## 2021-01-24 DIAGNOSIS — F17210 Nicotine dependence, cigarettes, uncomplicated: Secondary | ICD-10-CM | POA: Insufficient documentation

## 2021-01-24 DIAGNOSIS — R519 Headache, unspecified: Secondary | ICD-10-CM | POA: Insufficient documentation

## 2021-01-24 DIAGNOSIS — J069 Acute upper respiratory infection, unspecified: Secondary | ICD-10-CM | POA: Insufficient documentation

## 2021-01-24 LAB — RESP PANEL BY RT-PCR (FLU A&B, COVID) ARPGX2
Influenza A by PCR: NEGATIVE
Influenza B by PCR: NEGATIVE
SARS Coronavirus 2 by RT PCR: NEGATIVE

## 2021-01-24 NOTE — ED Provider Notes (Signed)
[facility] Emergency Department Provider Note   ____________________________________________   Event Date/Time   First MD Initiated Contact with Patient 01/24/21 1115     (approximate)  I have reviewed the triage vital signs and the nursing notes.   HISTORY  Chief Complaint Nasal Congestion    HPI Riley Morgan is a 30 y.o. male patient presents to the emergency room with 24-hour complaint of cough, sore throat, runny nose, headache and nasal congestion.  Headache is described as a 3 out of 10 throbbing ache.  Patient reports that this a.m. he experienced some shortness of breath but that has resolved.  Was exposed to his best friend who tested positive for COVID 2 days ago.  Patient is unvaccinated against COVID.  Patient denies body aches.  Patient has taken no over-the-counter medication for symptoms.  Patient is afebrile.  History reviewed. No pertinent past medical history.  There are no problems to display for this patient.   History reviewed. No pertinent surgical history.  Prior to Admission medications   Not on File    Allergies Patient has no known allergies.  History reviewed. No pertinent family history.  Social History Social History   Tobacco Use   Smoking status: Every Day    Types: Cigarettes   Smokeless tobacco: Never  Substance Use Topics   Alcohol use: No    Comment: 5th a week   Drug use: Yes    Types: Marijuana    Review of Systems  Constitutional: No fever/chills Eyes: No visual changes. ENT: Complains of sore throat, nasal congestion, runny nose Cardiovascular: Denies chest pain. Respiratory: Denies shortness of breath at this time however does endorse that this a.m. he experienced shortness of breath for a brief period of time.  Patient does endorse cough. Gastrointestinal: No abdominal pain.  No nausea, no vomiting.  No diarrhea.  No constipation. Genitourinary: Negative for  dysuria. Musculoskeletal: Negative for back pain.  Denies body aches. Skin: Negative for rash. Neurological: Negative focal weakness or numbness.  Reports headache.   ____________________________________________   PHYSICAL EXAM:  VITAL SIGNS: ED Triage Vitals [01/24/21 1024]  Enc Vitals Group     BP (!) 147/99     Pulse Rate 85     Resp 18     Temp 98 F (36.7 C)     Temp Source Oral     SpO2 97 %     Weight 275 lb (124.7 kg)     Height 5\' 9"  (1.753 m)     Head Circumference      Peak Flow      Pain Score 0     Pain Loc      Pain Edu?      Excl. in GC?     Constitutional: Alert and oriented. Well appearing and in no acute distress. Eyes: Conjunctivae are normal. PERRL. EOMI. Head: Atraumatic. Nose: Nasal congestion and rhinorrhea noted Mouth/Throat: Mucous membranes are moist.  Oropharynx erythematous. Neck: No stridor.  Hematological/Lymphatic/Immunilogical: No cervical lymphadenopathy. Cardiovascular: Normal rate, regular rhythm. Grossly normal heart sounds.  Good peripheral circulation.  Patient slightly hypertensive Respiratory: Normal respiratory effort.  No retractions. Lungs CTAB.  No cough noted during exam. Gastrointestinal: Soft and nontender. No distention. No abdominal bruits. No CVA tenderness. Musculoskeletal: No lower extremity tenderness nor edema.  No joint effusions. Neurologic:  Normal speech and language. No gross focal neurologic deficits are appreciated. No gait instability. Skin:  Skin is warm, dry and intact. No rash noted.  Psychiatric: Mood and affect are normal. Speech and behavior are normal.  ____________________________________________   LABS (all labs ordered are listed, but only abnormal results are displayed)  Labs Reviewed  RESP PANEL BY RT-PCR (FLU A&B, COVID) ARPGX2   ____________________________________________  EKG  ____________________________________________  RADIOLOGY  ED MD interpretation: None  Official  radiology report(s): No results found.  ____________________________________________   PROCEDURES  Procedure(s) performed: None  Procedures  Critical Care performed: No  ____________________________________________   INITIAL IMPRESSION / ASSESSMENT AND PLAN / ED COURSE     30 year old male presents to the emergency room with complaint of exposure to positive COVID-patient 2 days ago.  Patient reports for the past 24 hours he has had cough, sore throat, runny nose, nasal congestion.  Patient has been afebrile.  Patient has taken no over-the-counter medications for symptoms. Will COVID and flu test patient today.  Patient has opted to go home and have results reviewed through MyChart.  Patient is requesting a work note.  As he is unvaccinated the work note will read if COVID-positive that he should quarantine and miss work for the next 10 days.  Patient is recommended to take over-the-counter medications to treat his symptoms.  If he exhibits chest pain shortness of breath or increased work of breathing he should report back to the emergency room. Patient is discharged in stable condition.      ____________________________________________   FINAL CLINICAL IMPRESSION(S) / ED DIAGNOSES  Final diagnoses:  Viral upper respiratory tract infection  Suspected COVID-19 virus infection     ED Discharge Orders     None        Note:  This document was prepared using Dragon voice recognition software and may include unintentional dictation errors.     Herschell Dimes, NP 01/24/21 1151    Georga Hacking, MD 01/24/21 [phone]

## 2021-01-24 NOTE — ED Triage Notes (Signed)
Pt via POV from home. Pt states he has been exposed to COVID. Pt states he found out his friend was positive yesterday. Pt has been having cough, nasal congestion, and chills since this morning. Denies any SOB/CP. Pt is A&Ox4 and NAD.

## 2021-01-24 NOTE — ED Notes (Signed)
Pt nasal swab obtained and sent to lab

## 2021-04-03 ENCOUNTER — Other Ambulatory Visit: Payer: Self-pay

## 2021-04-03 ENCOUNTER — Emergency Department
Admission: EM | Admit: 2021-04-03 | Discharge: 2021-04-04 | Disposition: A | Discharge status: Home / Self Care | Payer: Self-pay | Attending: Emergency Medicine | Admitting: Emergency Medicine

## 2021-04-03 DIAGNOSIS — Z23 Encounter for immunization: Secondary | ICD-10-CM | POA: Insufficient documentation

## 2021-04-03 DIAGNOSIS — F17210 Nicotine dependence, cigarettes, uncomplicated: Secondary | ICD-10-CM | POA: Insufficient documentation

## 2021-04-03 DIAGNOSIS — S51831A Puncture wound without foreign body of right forearm, initial encounter: Secondary | ICD-10-CM | POA: Insufficient documentation

## 2021-04-03 DIAGNOSIS — W260XXA Contact with knife, initial encounter: Secondary | ICD-10-CM | POA: Insufficient documentation

## 2021-04-03 DIAGNOSIS — S51811A Laceration without foreign body of right forearm, initial encounter: Secondary | ICD-10-CM

## 2021-04-03 MED ORDER — TETANUS-DIPHTH-ACELL PERTUSSIS 5-2.5-18.5 LF-MCG/0.5 IM SUSY: 0.5000 mL | PREFILLED_SYRINGE | Freq: Once | INTRAMUSCULAR | Status: AC

## 2021-04-03 MED ORDER — TETANUS-DIPHTH-ACELL PERTUSSIS 5-2.5-18.5 LF-MCG/0.5 IM SUSY
PREFILLED_SYRINGE | INTRAMUSCULAR | Status: AC
  Administered 2021-04-03: 0.5 mL via INTRAMUSCULAR
  Filled 2021-04-03: qty 0.5
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
## 2021-04-04 ENCOUNTER — Emergency Department: Payer: Self-pay

## 2021-04-04 MED ORDER — OXYCODONE-ACETAMINOPHEN 5-325 MG PO TABS
1.0000 | ORAL_TABLET | Freq: Once | ORAL | Status: AC
  Administered 2021-04-04: 1 via ORAL
  Filled 2021-04-04: qty 1

## 2021-04-04 MED ORDER — OXYCODONE-ACETAMINOPHEN 5-325 MG PO TABS: 1.0000 | ORAL_TABLET | ORAL | 0 refills | Status: AC | PRN

## 2021-04-04 MED ORDER — LIDOCAINE HCL (PF) 1 % IJ SOLN: 5.0000 mL | Freq: Once | INTRAMUSCULAR | Status: AC

## 2021-04-04 MED ORDER — LIDOCAINE HCL (PF) 1 % IJ SOLN
INTRAMUSCULAR | Status: AC
  Administered 2021-04-04: 5 mL
  Filled 2021-04-04: qty 5
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
## 2021-04-05 ENCOUNTER — Emergency Department
Admission: EM | Admit: 2021-04-05 | Discharge: 2021-04-05 | Disposition: A | Discharge status: Home / Self Care | Payer: Self-pay | Attending: Emergency Medicine | Admitting: Emergency Medicine

## 2021-04-05 ENCOUNTER — Other Ambulatory Visit: Payer: Self-pay

## 2021-04-05 DIAGNOSIS — F17210 Nicotine dependence, cigarettes, uncomplicated: Secondary | ICD-10-CM | POA: Insufficient documentation

## 2021-04-05 DIAGNOSIS — S51011D Laceration without foreign body of right elbow, subsequent encounter: Secondary | ICD-10-CM

## 2021-04-05 MED ORDER — LIDOCAINE-EPINEPHRINE (PF) 2 %-1:200000 IJ SOLN
10.0000 mL | Freq: Once | INTRAMUSCULAR | Status: AC
  Administered 2021-04-05: 10 mL
  Filled 2021-04-05: qty 20
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
## 2021-04-05 NOTE — ED Notes (Signed)
Provider at bedside to suture laceration.

## 2021-04-05 NOTE — ED Triage Notes (Signed)
Pt states he was seen here yesterday and had stitches place on his R elbow- pt states the stitches came out and it was bleeding- pt has it covered with a bandage

## 2021-04-05 NOTE — Discharge Instructions (Addendum)
-  Please return to the emergency department if you begin to experience any signs of infection in the wound.  This includes fever/chills, redness around the suture site, or drainage of pus. -Follow-up with your PCP or return to the emergency department for suture removal in 7 days. -Keep the suture site dry for the next 24 hours.  You may wash with soap and water afterwards.

## 2021-04-13 ENCOUNTER — Emergency Department
Admission: EM | Admit: 2021-04-13 | Discharge: 2021-04-13 | Disposition: A | Discharge status: Home / Self Care | Payer: Self-pay | Attending: Family Medicine | Admitting: Family Medicine

## 2021-04-13 ENCOUNTER — Encounter: Payer: Self-pay | Admitting: Emergency Medicine

## 2021-04-13 ENCOUNTER — Other Ambulatory Visit: Payer: Self-pay

## 2021-04-13 DIAGNOSIS — Z4802 Encounter for removal of sutures: Secondary | ICD-10-CM | POA: Insufficient documentation

## 2021-04-13 DIAGNOSIS — F17210 Nicotine dependence, cigarettes, uncomplicated: Secondary | ICD-10-CM | POA: Insufficient documentation

## 2021-04-13 DIAGNOSIS — L03113 Cellulitis of right upper limb: Secondary | ICD-10-CM | POA: Insufficient documentation

## 2021-04-13 MED ORDER — CLINDAMYCIN HCL 300 MG PO CAPS: 300.0000 mg | ORAL_CAPSULE | Freq: Three times a day (TID) | ORAL | 0 refills | Status: AC

## 2021-04-13 NOTE — ED Triage Notes (Signed)
Here for suture removal

## 2021-04-21 NOTE — ED Provider Notes (Signed)
[facility] Emergency Department Provider Note  ________________   I have reviewed the triage vital signs and the nursing notes.   HISTORY  Chief Complaint Suture / Staple Removal (/)   HPI  Riley Morgan is a 31 y.o. male presents to the ER for suture removal. Sutures were inserted here on 04/05/21. Area is tender. Patient concerned it is infected.   History reviewed. No pertinent past medical history.  There are no problems to display for this patient.   History reviewed. No pertinent surgical history.  Current Outpatient Rx   Order #: [id number] Class: Normal   Order #: [id number] Class: Normal    Allergies Patient has no known allergies.  No family history on file.  Social History Social History   Tobacco Use   Smoking status: Every Day    Types: Cigarettes   Smokeless tobacco: Never  Vaping Use   Vaping Use: Never used  Substance Use Topics   Alcohol use: No    Comment: 5th a week   Drug use: Yes    Types: Marijuana    Review of Systems  Constitutional: Denies fever.  HEENT: No change from baseline Respiratory: No cough or shortness of breath Musculoskeletal: No pain. Skin: healing wound; pain gradually resolving.  ____________________________________________   PHYSICAL EXAM:  VITAL SIGNS: ED Triage Vitals  Enc Vitals Group     BP 04/13/21 1403 (!) 128/95     Pulse Rate 04/13/21 1403 83     Resp 04/13/21 1403 20     Temp 04/13/21 1403 98.8 F (37.1 C)     Temp Source 04/13/21 1403 Oral     SpO2 04/13/21 1403 97 %     Weight 04/13/21 1357 274 lb 14.6 oz (124.7 kg)     Height 04/13/21 1357 5\' 9"  (1.753 m)     Head Circumference --      Peak Flow --      Pain Score 04/13/21 1357 0     Pain Loc --      Pain Edu? --      Excl. in [place]? --     Constitutional: Appears well. No distress HEENT: Atraumtaic, normal appearance, sclera normal, voice normal. Respiratory: Respirations even and unlabored.   Cardiovascular: Capillary refill normal. Peripheral pulses 2+ Musculoskeletal: Full ROM x 4. Skin: Well approximated. Erythema around suture site. Neurovascular: Gait steady; Alert and oriented x 4.   PROCEDURES  Procedure(s) performed: SUTURE REMOVAL Performed by: ER tech  Location details: right elbow  Wound Appearance: clean  Sutures/Staples Removed: 3  Facility: sutures placed in this facility  Patient tolerance: Patient tolerated the procedure well with no immediate complications.    ____________________________________________   INITIAL IMPRESSION / ASSESSMENT AND PLAN / ED COURSE  Pertinent labs & imaging results that were available during my care of the patient were reviewed by me and considered in my medical decision making (see chart for details).   Wound care discussed. Will prescribe antibiotics. Patient advised to keep covered with sunscreen. Patient was advised to return to the ER for symptoms that change or worsen if unable to schedule an appointment with primary care.  ____________________________________________   FINAL CLINICAL IMPRESSION(S) / ED DIAGNOSES  Final diagnoses:  Visit for suture removal  Cellulitis of right upper extremity      Victorino Dike, FNP 04/21/21 0740    Lavonia Drafts, MD 04/22/21 [id number]

## 2021-06-04 ENCOUNTER — Other Ambulatory Visit: Payer: Self-pay

## 2021-06-04 ENCOUNTER — Emergency Department: Payer: Self-pay

## 2021-06-04 DIAGNOSIS — S060X9A Concussion with loss of consciousness of unspecified duration, initial encounter: Secondary | ICD-10-CM | POA: Insufficient documentation

## 2021-06-04 DIAGNOSIS — S0181XA Laceration without foreign body of other part of head, initial encounter: Secondary | ICD-10-CM | POA: Insufficient documentation

## 2021-06-04 DIAGNOSIS — Z23 Encounter for immunization: Secondary | ICD-10-CM | POA: Insufficient documentation
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
## 2021-06-05 ENCOUNTER — Emergency Department
Admission: EM | Admit: 2021-06-05 | Discharge: 2021-06-05 | Disposition: A | Discharge status: Home / Self Care | Payer: Self-pay | Attending: Emergency Medicine | Admitting: Emergency Medicine

## 2021-06-05 DIAGNOSIS — S0181XA Laceration without foreign body of other part of head, initial encounter: Secondary | ICD-10-CM

## 2021-06-05 DIAGNOSIS — S060X9A Concussion with loss of consciousness of unspecified duration, initial encounter: Secondary | ICD-10-CM

## 2021-06-05 DIAGNOSIS — S0990XA Unspecified injury of head, initial encounter: Secondary | ICD-10-CM

## 2021-06-05 MED ORDER — IBUPROFEN 800 MG PO TABS: 800.0000 mg | ORAL_TABLET | Freq: Three times a day (TID) | ORAL | 0 refills | Status: AC | PRN

## 2021-06-05 MED ORDER — BACITRACIN ZINC 500 UNIT/GM EX OINT
TOPICAL_OINTMENT | Freq: Once | CUTANEOUS | Status: AC
  Filled 2021-06-05: qty 0.9

## 2021-06-05 MED ORDER — IBUPROFEN 800 MG PO TABS
800.0000 mg | ORAL_TABLET | Freq: Once | ORAL | Status: AC
  Administered 2021-06-05: 800 mg via ORAL
  Filled 2021-06-05: qty 1

## 2021-06-05 MED ORDER — TETANUS-DIPHTH-ACELL PERTUSSIS 5-2.5-18.5 LF-MCG/0.5 IM SUSY
0.5000 mL | PREFILLED_SYRINGE | Freq: Once | INTRAMUSCULAR | Status: AC
  Administered 2021-06-05: 0.5 mL via INTRAMUSCULAR
  Filled 2021-06-05: qty 0.5

## 2021-06-05 MED ORDER — LIDOCAINE-EPINEPHRINE (PF) 2 %-1:200000 IJ SOLN
20.0000 mL | Freq: Once | INTRAMUSCULAR | Status: AC
  Administered 2021-06-05: 20 mL via INTRADERMAL
  Filled 2021-06-05: qty 20
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
## 2021-06-05 NOTE — ED Notes (Signed)
E-signature pad unavailable - Pt verbalized understanding of D/C information - no additional concerns at this time.  
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
